# Patient Record
Sex: Female | Born: 1990 | Race: White | Hispanic: No | Marital: Single | Smoking: Never smoker
Health system: Southern US, Community
[De-identification: ages and names within clinical notes are randomized; demographics above are authoritative.]

## PROBLEM LIST (undated history)

## (undated) HISTORY — PX: SHOULDER SURGERY: SHX246

---

## 2014-09-11 ENCOUNTER — Encounter (HOSPITAL_COMMUNITY): Payer: Self-pay

## 2014-09-11 ENCOUNTER — Emergency Department (HOSPITAL_COMMUNITY): Payer: 59

## 2014-09-11 ENCOUNTER — Emergency Department (HOSPITAL_COMMUNITY)
Admission: EM | Admit: 2014-09-11 | Discharge: 2014-09-11 | Disposition: A | Discharge status: Home / Self Care | Payer: 59 | Attending: Emergency Medicine | Admitting: Emergency Medicine

## 2014-09-11 DIAGNOSIS — Z3202 Encounter for pregnancy test, result negative: Secondary | ICD-10-CM | POA: Insufficient documentation

## 2014-09-11 DIAGNOSIS — R002 Palpitations: Secondary | ICD-10-CM | POA: Diagnosis present

## 2014-09-11 DIAGNOSIS — R110 Nausea: Secondary | ICD-10-CM | POA: Diagnosis not present

## 2014-09-11 LAB — HCG, SERUM, QUALITATIVE: Preg, Serum: NEGATIVE

## 2014-09-11 LAB — CBC WITH DIFFERENTIAL/PLATELET
Basophils Absolute: 0 10*3/uL (ref 0.0–0.1)
Basophils Relative: 0 % (ref 0–1)
Eosinophils Absolute: 0.1 10*3/uL (ref 0.0–0.7)
Eosinophils Relative: 1 % (ref 0–5)
HCT: 37.8 % (ref 36.0–46.0)
HEMOGLOBIN: 12.3 g/dL (ref 12.0–15.0)
Lymphocytes Relative: 18 % (ref 12–46)
Lymphs Abs: 1.1 10*3/uL (ref 0.7–4.0)
MCH: 30.8 pg (ref 26.0–34.0)
MCHC: 32.5 g/dL (ref 30.0–36.0)
MCV: 94.5 fL (ref 78.0–100.0)
Monocytes Absolute: 0.5 10*3/uL (ref 0.1–1.0)
Monocytes Relative: 9 % (ref 3–12)
NEUTROS ABS: 4.2 10*3/uL (ref 1.7–7.7)
Neutrophils Relative %: 72 % (ref 43–77)
PLATELETS: 249 10*3/uL (ref 150–400)
RBC: 4 MIL/uL (ref 3.87–5.11)
RDW: 12.7 % (ref 11.5–15.5)
WBC: 5.9 10*3/uL (ref 4.0–10.5)

## 2014-09-11 LAB — BASIC METABOLIC PANEL
Anion gap: 7 (ref 5–15)
BUN: 14 mg/dL (ref 6–20)
CO2: 25 mmol/L (ref 22–32)
CREATININE: 0.58 mg/dL (ref 0.44–1.00)
Calcium: 8.7 mg/dL — ABNORMAL LOW (ref 8.9–10.3)
Chloride: 104 mmol/L (ref 101–111)
GFR calc Af Amer: >60 mL/min (ref >60)
GFR calc non Af Amer: >60 mL/min (ref >60)
Glucose, Bld: 107 mg/dL — ABNORMAL HIGH (ref 65–99)
Potassium: 3.8 mmol/L (ref 3.5–5.1)
Sodium: 136 mmol/L (ref 135–145)

## 2014-09-11 LAB — MAGNESIUM: Magnesium: 2 mg/dL (ref 1.7–2.4)

## 2014-09-11 LAB — PHOSPHORUS: Phosphorus: 3.7 mg/dL (ref 2.5–4.6)

## 2014-09-11 NOTE — Discharge Instructions (Signed)
Return without fail for worsening symptoms, including persistent symptoms, worsening pain, passing out, or any other symptoms concerning to you.  Palpitations A palpitation is the feeling that your heartbeat is irregular. It may feel like your heart is fluttering or skipping a beat. It may also feel like your heart is beating faster than normal. This is usually not a serious problem. In some cases, you may need more medical tests. HOME CARE  Avoid:  Caffeine in coffee, tea, soft drinks, diet pills, and energy drinks.  Chocolate.  Alcohol.  Stop smoking if you smoke.  Reduce your stress and anxiety. Try:  A method that measures bodily functions so you can learn to control them (biofeedback).  Yoga.  Meditation.  Physical activity such as swimming, jogging, or walking.  Get plenty of rest and sleep. GET HELP IF:  Your fast or irregular heartbeat continues after 24 hours.  Your palpitations occur more often. GET HELP RIGHT AWAY IF:   You have chest pain.  You feel short of breath.  You have a very bad headache.  You feel dizzy or pass out (faint). MAKE SURE YOU:   Understand these instructions.  Will watch your condition.  Will get help right away if you are not doing well or get worse. Document Released: 10/31/2007 Document Revised: 06/07/2013 Document Reviewed: 03/22/2011 [facility] Patient Information 2015 [place], [place]. This information is not intended to replace advice given to you by your health care provider. Make sure you discuss any questions you have with your health care provider.

## 2014-09-11 NOTE — ED Provider Notes (Signed)
CSN: [id number]     Arrival date & time 09/11/14  1820 History   First MD Initiated Contact with Patient 09/11/14 1827     Chief Complaint  Patient presents with  . Palpitations  . Nausea     (Consider location/radiation/quality/duration/timing/severity/associated sxs/prior Treatment) HPI 24 year old , otherwise healthy, who presents with palpitations. She is a long distance from her, and reports that she ran 10 miles yesterday evening. She had attempted to hydrate well throughout the day today, but this evening began to feel lightheaded and unwell. She noticed a few seconds of sharp intermittent chest discomfort, associated with nausea and palpitations. She reports that she began to "panic" and started feeling diaphoretic over the back of her neck and short of breath. She subsequently came to the ED for evaluation. She denies any alcohol or or illicit drug usage. She denies any heavy caffeine or energy drink usage. She has not had any significant recent weight loss or dieting. She denies any vomiting, diarrhea, fevers, chills, cough, congestion, sore throat, runny nose, or any recent illnesses. Denies any history of exertional chest pain, difficulty breathing, syncope or near syncope, and does not have any family history of heart problems.   History reviewed. No pertinent past medical history. Past Surgical History  Procedure Laterality Date  . Shoulder surgery     History reviewed. No pertinent family history. History  Substance Use Topics  . Smoking status: Never Smoker   . Smokeless tobacco: Not on file  . Alcohol Use: Yes   OB History    No data available     Review of Systems 10/14 systems reviewed and are negative other than those stated in the HPI    Allergies  Review of patient's allergies indicates no known allergies.  Home Medications   Prior to Admission medications   Medication Sig Start Date End Date Taking? Authorizing Provider  ibuprofen (ADVIL,MOTRIN) 200 MG  tablet Take 400 mg by mouth every 6 (six) hours as needed for headache.   Yes Historical Provider, MD   BP 109/63 mmHg  Pulse 64  Temp(Src) 98.3 F (36.8 C) (Oral)  Resp 18  SpO2 100%  LMP 08/28/2014 Physical Exam Physical Exam  Nursing note and vitals reviewed. Constitutional: Well developed, well nourished, non-toxic, and in no acute distress Head: Normocephalic and atraumatic.  Mouth/Throat: Oropharynx is clear and moist.  Neck: Normal range of motion. Neck supple.  Cardiovascular: regular rate and regular rhythm.   Pulmonary/Chest: Effort normal and breath sounds normal.  Abdominal: Soft. There is no tenderness. There is no rebound and no guarding.  Musculoskeletal: Normal range of motion.  Neurological: Alert, no facial droop, fluent speech, moves all extremities symmetrically Skin: Skin is warm and dry.  Psychiatric: Cooperative  ED Course  Procedures (including critical care time) Labs Review Labs Reviewed  BASIC METABOLIC PANEL - Abnormal; Notable for the following:    Glucose, Bld 107 (*)    Calcium 8.7 (*)    All other components within normal limits  CBC WITH DIFFERENTIAL/PLATELET  MAGNESIUM  PHOSPHORUS  HCG, SERUM, QUALITATIVE    Imaging Review Dg Chest 2 View  09/11/2014   CLINICAL DATA:  Tachycardia and nausea  EXAM: CHEST  2 VIEW  COMPARISON:  None.  FINDINGS: Lungs are clear. Heart size and pulmonary vascularity are normal. No adenopathy no pneumothorax. No bone lesions.  IMPRESSION: No abnormality noted.   Electronically Signed   By: Bretta Bang III M.D.   On: 09/11/2014 19:14  EKG Interpretation   Date/Time:   year old female who presents with palpations and nausea. She is non-toxic and in no acute distress. Asymptomatic at time of presentation. VS normal.  EKG shows sinus bradycardia, and no stigmata of arrhythmia. No history of exertion chest pain, syncope, or near syncope. No family history of sudden death or serious cardiac problems in early life. PERC negative. Presentation not suggestive of PE. CXR shows no acute cardiopulmonary processes. Blood work showing no major electrolyte or metabolic derangements. 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 Counseling provided regarding adequate nutrition and eating habits. Strict return and follow-up instructions reviewed. She expressed understanding of all discharge instructions and felt comfortable with the plan of care.     Lavera Guise, MD 09/12/14 [phone]

## 2014-09-11 NOTE — ED Notes (Signed)
Pt c/o "heart beating really fast" and nausea starting this morning.  Denies pain.  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  Pt is concerned for dehydration.  Pt's father thinks she is "overly anxious."

## 2014-09-11 NOTE — ED Notes (Signed)
Unable to collect labs at this time patient is in xray 

## 2017-02-06 IMAGING — CR DG CHEST 2V
2 series · 2 of 2 positions shown · non-contrast
Comparison: None.

CLINICAL DATA: Tachycardia and nausea

EXAM:
CHEST  2 VIEW

[w chest pa]
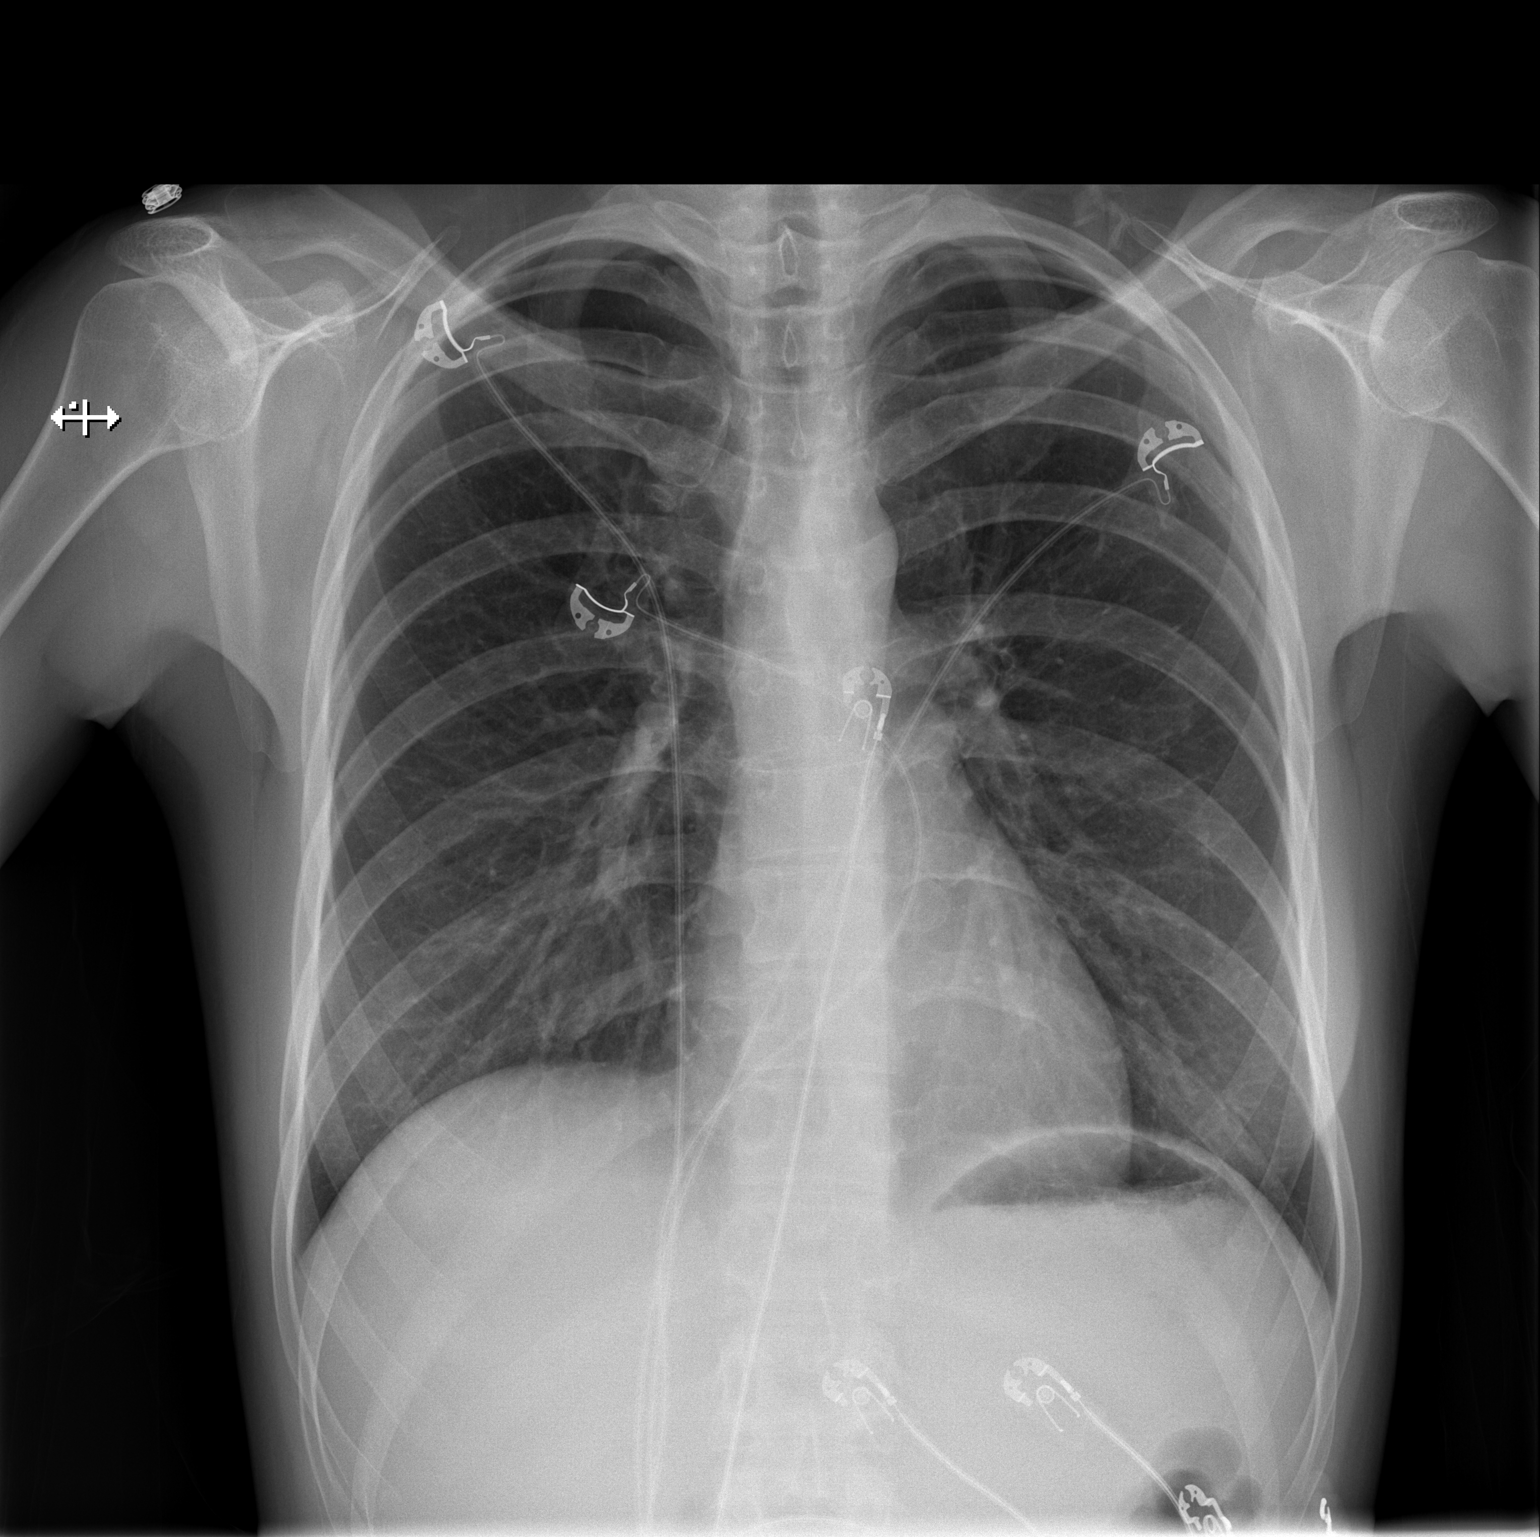

[w chest lat]
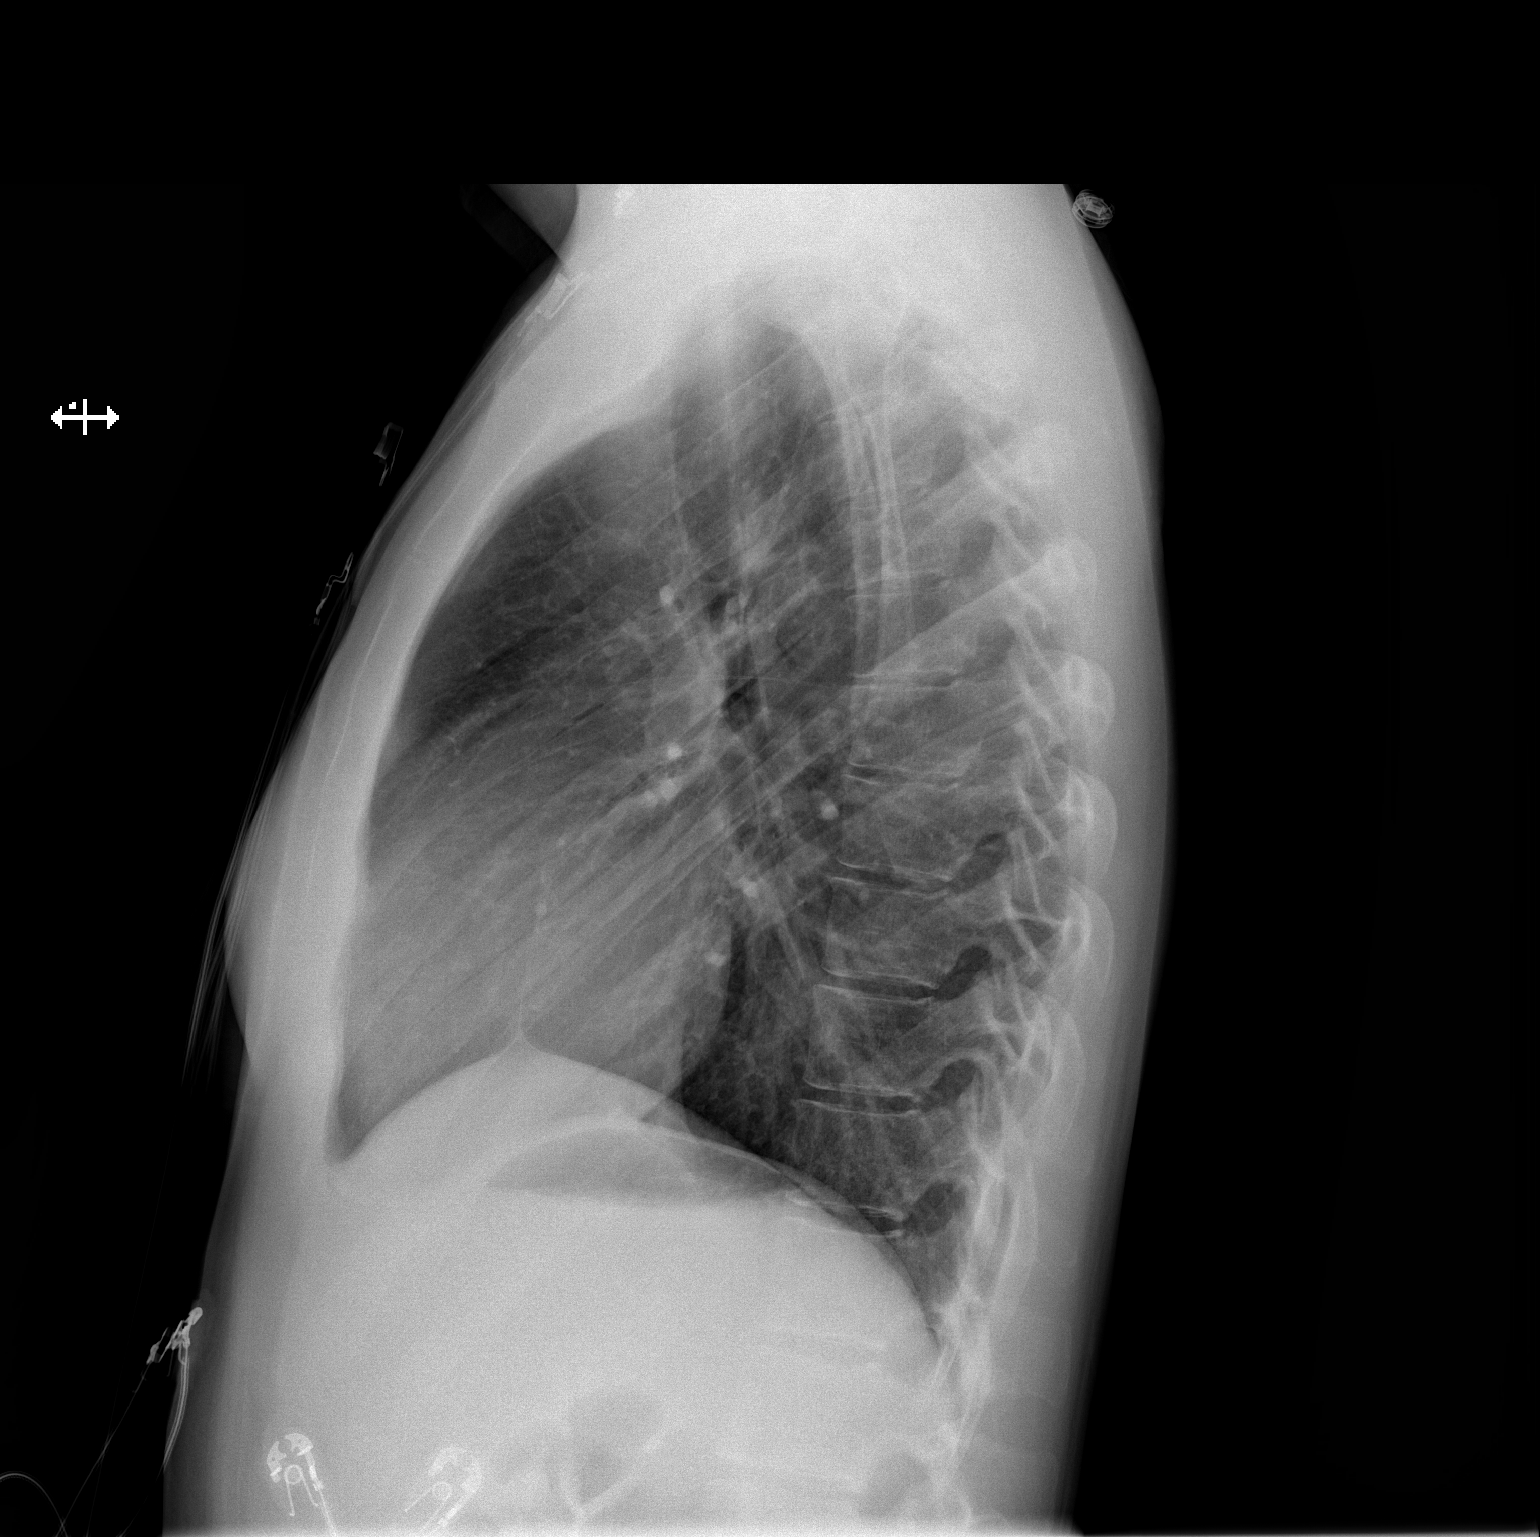

[2 of 2 positions shown; findings below may reference images not displayed]

FINDINGS: Lungs are clear. Heart size and pulmonary vascularity are normal. No
adenopathy no pneumothorax. No bone lesions.
IMPRESSION: No abnormality noted.
